# Patient Record
Sex: Female | Born: 1940 | Race: White | Hispanic: No | Smoking: Former smoker
Health system: Southern US, Community
[De-identification: ages and names within clinical notes are randomized; demographics above are authoritative.]

## PROBLEM LIST (undated history)

## (undated) DIAGNOSIS — J449 Chronic obstructive pulmonary disease, unspecified: Secondary | ICD-10-CM

## (undated) DIAGNOSIS — R079 Chest pain, unspecified: Secondary | ICD-10-CM

## (undated) DIAGNOSIS — J45909 Unspecified asthma, uncomplicated: Secondary | ICD-10-CM

## (undated) DIAGNOSIS — R918 Other nonspecific abnormal finding of lung field: Secondary | ICD-10-CM

## (undated) DIAGNOSIS — F419 Anxiety disorder, unspecified: Secondary | ICD-10-CM

## (undated) DIAGNOSIS — M810 Age-related osteoporosis without current pathological fracture: Secondary | ICD-10-CM

## (undated) DIAGNOSIS — E785 Hyperlipidemia, unspecified: Secondary | ICD-10-CM

## (undated) DIAGNOSIS — Z8701 Personal history of pneumonia (recurrent): Secondary | ICD-10-CM

## (undated) DIAGNOSIS — I493 Ventricular premature depolarization: Secondary | ICD-10-CM

## (undated) DIAGNOSIS — E039 Hypothyroidism, unspecified: Secondary | ICD-10-CM

## (undated) DIAGNOSIS — I10 Essential (primary) hypertension: Secondary | ICD-10-CM

## (undated) DIAGNOSIS — S02609A Fracture of mandible, unspecified, initial encounter for closed fracture: Secondary | ICD-10-CM

## (undated) DIAGNOSIS — L309 Dermatitis, unspecified: Secondary | ICD-10-CM

## (undated) HISTORY — DX: Personal history of pneumonia (recurrent): Z87.01

## (undated) HISTORY — PX: BREAST ENHANCEMENT SURGERY: SHX7

## (undated) HISTORY — DX: Hypothyroidism, unspecified: E03.9

## (undated) HISTORY — DX: Hyperlipidemia, unspecified: E78.5

## (undated) HISTORY — PX: MANDIBLE SURGERY: SHX707

## (undated) HISTORY — DX: Unspecified asthma, uncomplicated: J45.909

## (undated) HISTORY — DX: Fracture of mandible, unspecified, initial encounter for closed fracture: S02.609A

## (undated) HISTORY — DX: Chronic obstructive pulmonary disease, unspecified: J44.9

## (undated) HISTORY — DX: Ventricular premature depolarization: I49.3

## (undated) HISTORY — DX: Chest pain, unspecified: R07.9

## (undated) HISTORY — DX: Age-related osteoporosis without current pathological fracture: M81.0

## (undated) HISTORY — DX: Dermatitis, unspecified: L30.9

## (undated) HISTORY — DX: Other nonspecific abnormal finding of lung field: R91.8

## (undated) HISTORY — DX: Anxiety disorder, unspecified: F41.9

## (undated) HISTORY — DX: Essential (primary) hypertension: I10

## (undated) HISTORY — PX: THYROID SURGERY: SHX805

---

## 2004-12-08 ENCOUNTER — Ambulatory Visit: Payer: Self-pay

## 2006-05-10 ENCOUNTER — Ambulatory Visit: Payer: Self-pay

## 2006-09-01 HISTORY — PX: CARDIAC CATHETERIZATION: SHX172

## 2006-09-12 ENCOUNTER — Ambulatory Visit: Payer: Self-pay | Admitting: Cardiovascular Disease

## 2006-09-13 ENCOUNTER — Ambulatory Visit: Payer: Self-pay | Admitting: Cardiovascular Disease

## 2006-10-02 ENCOUNTER — Ambulatory Visit: Payer: Self-pay | Admitting: Internal Medicine

## 2006-10-23 ENCOUNTER — Ambulatory Visit: Payer: Self-pay | Admitting: Internal Medicine

## 2006-11-01 ENCOUNTER — Ambulatory Visit: Payer: Self-pay | Admitting: Internal Medicine

## 2006-12-02 ENCOUNTER — Ambulatory Visit: Payer: Self-pay | Admitting: Internal Medicine

## 2007-01-01 ENCOUNTER — Ambulatory Visit: Payer: Self-pay | Admitting: Internal Medicine

## 2007-01-08 ENCOUNTER — Ambulatory Visit: Payer: Self-pay | Admitting: Internal Medicine

## 2007-02-01 ENCOUNTER — Ambulatory Visit: Payer: Self-pay | Admitting: Internal Medicine

## 2007-05-14 ENCOUNTER — Ambulatory Visit: Payer: Self-pay | Admitting: Internal Medicine

## 2007-06-03 ENCOUNTER — Ambulatory Visit: Payer: Self-pay | Admitting: Internal Medicine

## 2007-06-19 ENCOUNTER — Ambulatory Visit: Payer: Self-pay | Admitting: Gastroenterology

## 2007-08-06 ENCOUNTER — Ambulatory Visit: Payer: Self-pay | Admitting: Ophthalmology

## 2007-08-12 ENCOUNTER — Ambulatory Visit: Payer: Self-pay | Admitting: Ophthalmology

## 2008-06-18 ENCOUNTER — Ambulatory Visit: Payer: Self-pay | Admitting: Family Medicine

## 2009-07-22 ENCOUNTER — Ambulatory Visit: Payer: Self-pay | Admitting: Family Medicine

## 2010-04-28 ENCOUNTER — Ambulatory Visit: Payer: Self-pay | Admitting: Ophthalmology

## 2010-05-03 ENCOUNTER — Ambulatory Visit: Payer: Self-pay | Admitting: Family Medicine

## 2010-05-09 ENCOUNTER — Ambulatory Visit: Payer: Self-pay | Admitting: Ophthalmology

## 2010-07-26 ENCOUNTER — Ambulatory Visit: Payer: Self-pay | Admitting: General Surgery

## 2011-03-08 ENCOUNTER — Ambulatory Visit: Payer: Self-pay | Admitting: General Surgery

## 2011-08-22 ENCOUNTER — Ambulatory Visit: Payer: Self-pay

## 2011-12-05 ENCOUNTER — Ambulatory Visit: Payer: Self-pay | Admitting: Specialist

## 2012-04-25 ENCOUNTER — Ambulatory Visit: Payer: Self-pay | Admitting: Specialist

## 2012-09-11 ENCOUNTER — Ambulatory Visit: Payer: Self-pay

## 2012-10-29 ENCOUNTER — Ambulatory Visit: Payer: Self-pay | Admitting: Specialist

## 2012-11-11 ENCOUNTER — Ambulatory Visit: Payer: Self-pay | Admitting: Specialist

## 2013-01-30 ENCOUNTER — Encounter: Payer: Self-pay | Admitting: *Deleted

## 2013-01-31 ENCOUNTER — Ambulatory Visit (INDEPENDENT_AMBULATORY_CARE_PROVIDER_SITE_OTHER): Payer: Medicare Other | Admitting: Cardiovascular Disease

## 2013-01-31 ENCOUNTER — Encounter: Payer: Self-pay | Admitting: Cardiovascular Disease

## 2013-01-31 VITALS — BP 153/70 | HR 88 | Ht 70.0 in | Wt 157.5 lb

## 2013-01-31 DIAGNOSIS — R0602 Shortness of breath: Secondary | ICD-10-CM

## 2013-01-31 DIAGNOSIS — I4949 Other premature depolarization: Secondary | ICD-10-CM

## 2013-01-31 DIAGNOSIS — Z01810 Encounter for preprocedural cardiovascular examination: Secondary | ICD-10-CM

## 2013-01-31 DIAGNOSIS — R0789 Other chest pain: Secondary | ICD-10-CM

## 2013-01-31 DIAGNOSIS — R9431 Abnormal electrocardiogram [ECG] [EKG]: Secondary | ICD-10-CM

## 2013-01-31 DIAGNOSIS — I493 Ventricular premature depolarization: Secondary | ICD-10-CM

## 2013-01-31 DIAGNOSIS — I200 Unstable angina: Secondary | ICD-10-CM | POA: Insufficient documentation

## 2013-01-31 NOTE — Assessment & Plan Note (Signed)
The patient had 2 recent prolonged episodes of substernal chest tightness which lasted for about one hour. These are highly suggestive of angina. Since that time, she has been noted to have frequent PVCs during pulmonary rehabilitation. Hent baseline ECG is abnormal with 0.5 mm of ST depression in the inferior leads and V5 to V6. She is currently chest pain-free and has not had these symptoms in the last 48 hours. Thus, I don't feel that she needs to be admitted to the hospital. I discussed different management options with her and highly recommend proceeding with urgent cardiac catheterization and possible coronary intervention. I discussed the risks, benefits and alternatives. The alternative approach would be to proceed with a pharmacologic nuclear stress test. However, I suspect that this would have low diagnostic utility with high risk for artifacts. I asked her to take aspirin 81 mg once daily which normally she avoids taking due to easy bruising.

## 2013-01-31 NOTE — Patient Instructions (Addendum)
Your physician has requested that you have a cardiac catheterization. Cardiac catheterization is used to diagnose and/or treat various heart conditions. Doctors may recommend this procedure for a number of different reasons. The most common reason is to evaluate chest pain. Chest pain can be a symptom of coronary artery disease (CAD), and cardiac catheterization can show whether plaque is narrowing or blocking your heart's arteries. This procedure is also used to evaluate the valves, as well as measure the blood flow and oxygen levels in different parts of your heart. For further information please visit [email or web address] Please follow instruction sheet, as given.  [facility] Cardiac Cath Instructions   You are scheduled for a Cardiac Cath on:____08/04/14_____________________  Please arrive at __2:30_____pm on the day of your procedure  You will need to pre-register prior to the day of your procedure.  Enter through the [employer] at [facility].  Registration is the first desk on your right.  Please take the procedure order we have given you in order to be registered appropriately  Do not eat/drink anything after midnight  Someone will need to drive you home  It is recommended someone be with you for the first 24 hours after your procedure  Wear clothes that are easy to get on/off and wear slip on shoes if possible   Medications bring a current list of all medications with you  _X__ Do not take these medications before your procedure: Hydrochlorathiazide day of procedure.   Day of your procedure: Arrive at the [facility] entrance.  Free valet service is available.  After entering the [facility] please check-in at the registration desk (1st desk on your right) to receive your armband. After receiving your armband someone will escort you to the cardiac cath/special procedures waiting area.  The usual length of stay after your procedure is about 2 to 3 hours.  This can vary.  If you have any  questions, please call our office at [phone], or you may call the cardiac cath lab at [facility] directly at [phone]

## 2013-01-31 NOTE — Assessment & Plan Note (Signed)
I do think we have to rule out ischemic heart disease as an etiology especially with her recent symptoms of chest tightness. Treatment with a calcium channel blocker will be considered. I will likely avoid treatment with a beta blocker due to severe COPD.

## 2013-01-31 NOTE — Progress Notes (Signed)
HPI  This is a pleasant 72 year old female who is here today for urgent evaluation regarding chest tightness and frequent PVCs. She has no previous cardiac history. She had cardiac catheterization in 2008 which showed no evidence of obstructive coronary artery disease. She has known history of hypertension and hyperlipidemia. She also has severe COPD on home oxygen at 2-4 L. She quit smoking more than 10 years ago but was a heavy smoker for 40 years. Last weekend both on Saturday and Sunday early morning hours, she woke up with severe substernal chest tightness described as an elephant sitting on her chest which lasted for about one hour. She did not seek medical attention. She reports no previous similar episodes. She had been going to pulmonary rehabilitation for a long time and was noted this week to have frequent PVCs. She did have palpitations associated with this. She had significant dyspnea related to her COPD. She is currently chest pain-free. She does have history of pulmonary nodules which are being monitored with serial CT scans. She sees Dr. Fleming.  Allergies  Allergen Reactions  . Zithromax (Azithromycin)     Tendon problems     Current Outpatient Prescriptions on File Prior to Visit  Medication Sig Dispense Refill  . albuterol (PROVENTIL HFA;VENTOLIN HFA) 108 (90 BASE) MCG/ACT inhaler Inhale 2 puffs into the lungs every 6 (six) hours as needed for wheezing.      . aspirin 81 MG tablet Take 81 mg by mouth daily.      . benzonatate (TESSALON) 200 MG capsule Take 200 mg by mouth every 12 (twelve) hours as needed for cough.      . citalopram (CELEXA) 20 MG tablet Take 20 mg by mouth daily.      . fluticasone-salmeterol (ADVAIR HFA) 115-21 MCG/ACT inhaler Inhale 2 puffs into the lungs 2 (two) times daily.      . hydrochlorothiazide (HYDRODIURIL) 25 MG tablet Take 25 mg by mouth daily.      . levothyroxine (SYNTHROID, LEVOTHROID) 112 MCG tablet Take 112 mcg by mouth daily before  breakfast.      . LOVASTATIN PO Take 40 mg by mouth daily.       . theophylline (THEODUR) 100 MG 12 hr tablet Take 100 mg by mouth 2 (two) times daily.       No current facility-administered medications on file prior to visit.     Past Medical History  Diagnosis Date  . COPD (chronic obstructive pulmonary disease)   . Pneumonia   . Eczema   . Hypothyroidism   . Jaw fracture   . Osteoporosis   . Hyperlipidemia   . Asthma      Past Surgical History  Procedure Laterality Date  . Breast enhancement surgery    . Thyroid surgery    . Mandible surgery      fractured jaw   . Cardiac catheterization  09/2006    EF 60%     Family History  Problem Relation Age of Onset  . Heart disease Father     68   . Heart disease Brother 28  . Heart attack Brother 47     History   Social History  . Marital Status: Divorced    Spouse Name: N/A    Number of Children: N/A  . Years of Education: N/A   Occupational History  . Not on file.   Social History Main Topics  . Smoking status: Former Smoker -- 1.00 packs/day for 40 years    Types:  Cigarettes  . Smokeless tobacco: Not on file  . Alcohol Use: No  . Drug Use: No  . Sexually Active: Not on file   Other Topics Concern  . Not on file   Social History Narrative  . No narrative on file     ROS Constitutional: Negative for fever, chills, diaphoresis, activity change. HENT: Negative for hearing loss, nosebleeds, congestion, sore throat, facial swelling, drooling, trouble swallowing, neck pain, voice change, sinus pressure and tinnitus.  Eyes: Negative for photophobia, pain, discharge and visual disturbance.  Respiratory: Negative for apnea, cough and wheezing.  Cardiovascular: Negative for  leg swelling.  Gastrointestinal: Negative for nausea, vomiting, abdominal pain, diarrhea, constipation, blood in stool and abdominal distention.  Genitourinary: Negative for dysuria, urgency, frequency, hematuria and decreased urine  volume.  Musculoskeletal: Negative for myalgias, back pain, joint swelling, arthralgias and gait problem.  Skin: Negative for color change, pallor, rash and wound.  Neurological: Negative for dizziness, tremors, seizures, syncope, speech difficulty, weakness, light-headedness, numbness and headaches.  Psychiatric/Behavioral: Negative for suicidal ideas, hallucinations, behavioral problems and agitation. The patient is not nervous/anxious.     PHYSICAL EXAM   BP 153/70  Pulse 88  Ht 5\' 10"  (1.778 m)  Wt 157 lb 8 oz (71.442 kg)  BMI 22.6 kg/m2 Constitutional: She is oriented to person, place, and time. She appears well-developed and well-nourished. No distress.  HENT: No nasal discharge.  Head: Normocephalic and atraumatic.  Eyes: Pupils are equal and round. Right eye exhibits no discharge. Left eye exhibits no discharge.  Neck: Normal range of motion. Neck supple. No JVD present. No thyromegaly present.  Cardiovascular: Normal rate, regular rhythm, normal heart sounds. Exam reveals no gallop and no friction rub. No murmur heard.  Pulmonary/Chest: Effort normal and breath sounds normal. No stridor. No respiratory distress. She has no wheezes. She has no rales. She exhibits no tenderness.  Abdominal: Soft. Bowel sounds are normal. She exhibits no distension. There is no tenderness. There is no rebound and no guarding.  Musculoskeletal: Normal range of motion. She exhibits no edema and no tenderness.  Neurological: She is alert and oriented to person, place, and time. Coordination normal.  Skin: Skin is warm and dry. No rash noted. She is not diaphoretic. No erythema. No pallor.  Psychiatric: She has a normal mood and affect. Her behavior is normal. Judgment and thought content normal.     ZOX:WRUEA  Rhythm  - occasional ectopic ventricular beat    -Inferior inferolateral ST depression (0.5 mm) suggestive of ischemia.  ABNORMAL    ASSESSMENT AND PLAN

## 2013-02-01 LAB — CBC WITH DIFFERENTIAL/PLATELET
Eos: 4 % (ref 0–5)
HCT: 38.8 % (ref 34.0–46.6)
Immature Granulocytes: 0 % (ref 0–2)
Lymphocytes Absolute: 1.6 10*3/uL (ref 0.7–3.1)
MCH: 29.5 pg (ref 26.6–33.0)
MCHC: 33.2 g/dL (ref 31.5–35.7)
MCV: 89 fL (ref 79–97)
Monocytes Absolute: 0.7 10*3/uL (ref 0.1–0.9)
RDW: 13.7 % (ref 12.3–15.4)
WBC: 6.7 10*3/uL (ref 3.4–10.8)

## 2013-02-01 LAB — BASIC METABOLIC PANEL
BUN/Creatinine Ratio: 24 (ref 11–26)
GFR calc Af Amer: 91 mL/min/{1.73_m2} (ref >59)
GFR calc non Af Amer: 79 mL/min/{1.73_m2} (ref >59)
Potassium: 4 mmol/L (ref 3.5–5.2)
Sodium: 143 mmol/L (ref 134–144)

## 2013-02-03 ENCOUNTER — Ambulatory Visit: Payer: Self-pay | Admitting: Cardiovascular Disease

## 2013-02-03 ENCOUNTER — Encounter: Payer: Self-pay | Admitting: Cardiovascular Disease

## 2013-02-03 DIAGNOSIS — I200 Unstable angina: Secondary | ICD-10-CM

## 2013-02-12 ENCOUNTER — Inpatient Hospital Stay: Payer: Self-pay | Admitting: Specialist

## 2013-02-12 LAB — BASIC METABOLIC PANEL
Anion Gap: 5 — ABNORMAL LOW (ref 7–16)
Calcium, Total: 9.1 mg/dL (ref 8.5–10.1)
Creatinine: 0.64 mg/dL (ref 0.60–1.30)
EGFR (African American): >60
Glucose: 134 mg/dL — ABNORMAL HIGH (ref 65–99)
Potassium: 3.6 mmol/L (ref 3.5–5.1)
Sodium: 139 mmol/L (ref 136–145)

## 2013-02-12 LAB — CBC
HCT: 40.5 % (ref 35.0–47.0)
HGB: 13.3 g/dL (ref 12.0–16.0)
MCHC: 32.9 g/dL (ref 32.0–36.0)
RBC: 4.54 10*6/uL (ref 3.80–5.20)
WBC: 9.4 10*3/uL (ref 3.6–11.0)

## 2013-02-13 ENCOUNTER — Ambulatory Visit: Payer: Self-pay | Admitting: Cardiovascular Disease

## 2013-02-13 LAB — BASIC METABOLIC PANEL
BUN: 18 mg/dL (ref 7–18)
Chloride: 101 mmol/L (ref 98–107)
Co2: 32 mmol/L (ref 21–32)
EGFR (African American): >60
EGFR (Non-African Amer.): >60
Osmolality: 282 (ref 275–301)
Sodium: 138 mmol/L (ref 136–145)

## 2013-02-13 LAB — CBC WITH DIFFERENTIAL/PLATELET
HCT: 34.3 % — ABNORMAL LOW (ref 35.0–47.0)
HGB: 11.6 g/dL — ABNORMAL LOW (ref 12.0–16.0)
Lymphocyte #: 0.5 10*3/uL — ABNORMAL LOW (ref 1.0–3.6)
Lymphocyte %: 5.5 %
MCH: 29.7 pg (ref 26.0–34.0)
MCHC: 33.9 g/dL (ref 32.0–36.0)
MCV: 88 fL (ref 80–100)
Monocyte #: 0.1 x10 3/mm — ABNORMAL LOW (ref 0.2–0.9)
Monocyte %: 1.5 %
Neutrophil #: 7.8 10*3/uL — ABNORMAL HIGH (ref 1.4–6.5)
WBC: 8.4 10*3/uL (ref 3.6–11.0)

## 2013-02-14 LAB — THEOPHYLLINE LEVEL: Theophylline: 4.6 ug/mL — ABNORMAL LOW (ref 10.0–20.0)

## 2013-02-14 LAB — MAGNESIUM: Magnesium: 2.3 mg/dL

## 2013-02-16 LAB — POTASSIUM: Potassium: 3.9 mmol/L (ref 3.5–5.1)

## 2013-02-17 ENCOUNTER — Ambulatory Visit: Payer: Medicare Other | Admitting: Cardiovascular Disease

## 2013-02-17 LAB — CREATININE, SERUM
Creatinine: 0.72 mg/dL (ref 0.60–1.30)
EGFR (African American): >60
EGFR (Non-African Amer.): >60

## 2013-05-08 ENCOUNTER — Other Ambulatory Visit: Payer: Self-pay

## 2013-08-30 ENCOUNTER — Emergency Department: Payer: Self-pay | Admitting: Emergency Medicine

## 2013-08-30 LAB — CBC
HCT: 40.6 %
HGB: 12.8 g/dL
MCH: 28.4 pg
MCHC: 31.4 g/dL — ABNORMAL LOW
MCV: 90 fL
Platelet: 235 x10 3/mm 3
RBC: 4.5 X10 6/mm 3
RDW: 14.3 %
WBC: 13 x10 3/mm 3 — ABNORMAL HIGH

## 2013-08-30 LAB — BASIC METABOLIC PANEL WITH GFR
Anion Gap: 1 — ABNORMAL LOW
BUN: 23 mg/dL — ABNORMAL HIGH
Calcium, Total: 8.5 mg/dL
Chloride: 99 mmol/L
Co2: 39 mmol/L — ABNORMAL HIGH
Creatinine: 0.7 mg/dL
EGFR (African American): >60
EGFR (Non-African Amer.): >60
Glucose: 147 mg/dL — ABNORMAL HIGH
Osmolality: 284
Potassium: 3.5 mmol/L
Sodium: 139 mmol/L

## 2013-08-30 LAB — TROPONIN I: Troponin-I: <0.02 ng/mL

## 2013-08-31 LAB — TROPONIN I: Troponin-I: <0.02 ng/mL

## 2013-09-22 ENCOUNTER — Ambulatory Visit: Payer: Self-pay

## 2013-09-30 ENCOUNTER — Ambulatory Visit: Payer: Self-pay

## 2013-10-03 ENCOUNTER — Ambulatory Visit: Payer: Self-pay | Admitting: Specialist

## 2013-10-06 ENCOUNTER — Ambulatory Visit: Payer: Self-pay

## 2013-10-09 LAB — PATHOLOGY REPORT

## 2014-02-16 ENCOUNTER — Ambulatory Visit: Payer: Self-pay

## 2014-03-03 ENCOUNTER — Ambulatory Visit: Payer: Self-pay | Admitting: Internal Medicine

## 2014-03-10 LAB — COMPREHENSIVE METABOLIC PANEL
ALBUMIN: 3.4 g/dL (ref 3.4–5.0)
AST: 32 U/L (ref 15–37)
Alkaline Phosphatase: 66 U/L
Anion Gap: 10 (ref 7–16)
BUN: 18 mg/dL (ref 7–18)
Bilirubin,Total: 0.3 mg/dL (ref 0.2–1.0)
CHLORIDE: 91 mmol/L — AB (ref 98–107)
CREATININE: 1.03 mg/dL (ref 0.60–1.30)
Calcium, Total: 9.1 mg/dL (ref 8.5–10.1)
Co2: 39 mmol/L — ABNORMAL HIGH (ref 21–32)
GFR CALC NON AF AMER: 54 — AB
Glucose: 297 mg/dL — ABNORMAL HIGH (ref 65–99)
OSMOLALITY: 292 (ref 275–301)
Potassium: 3.1 mmol/L — ABNORMAL LOW (ref 3.5–5.1)
SGPT (ALT): 19 U/L
SODIUM: 140 mmol/L (ref 136–145)
Total Protein: 7.6 g/dL (ref 6.4–8.2)

## 2014-03-10 LAB — CBC
HCT: 41.6 % (ref 35.0–47.0)
HGB: 13.2 g/dL (ref 12.0–16.0)
MCH: 28.9 pg (ref 26.0–34.0)
MCHC: 31.8 g/dL — ABNORMAL LOW (ref 32.0–36.0)
MCV: 91 fL (ref 80–100)
Platelet: 333 10*3/uL (ref 150–440)
RBC: 4.58 10*6/uL (ref 3.80–5.20)
RDW: 14.1 % (ref 11.5–14.5)
WBC: 11.5 10*3/uL — ABNORMAL HIGH (ref 3.6–11.0)

## 2014-03-10 LAB — TROPONIN I: Troponin-I: <0.02 ng/mL

## 2014-03-10 LAB — CK TOTAL AND CKMB (NOT AT ARMC)
CK, Total: 41 U/L
CK-MB: 1.5 ng/mL (ref 0.5–3.6)

## 2014-03-11 ENCOUNTER — Inpatient Hospital Stay: Payer: Self-pay | Admitting: Internal Medicine

## 2014-03-11 LAB — URINALYSIS, COMPLETE
Bilirubin,UR: NEGATIVE
Granular Cast: 1
Hyaline Cast: 30
Ketone: NEGATIVE
LEUKOCYTE ESTERASE: NEGATIVE
NITRITE: NEGATIVE
Ph: 6 (ref 4.5–8.0)
Protein: 100
RBC,UR: 19 /HPF (ref 0–5)
SPECIFIC GRAVITY: 1.013 (ref 1.003–1.030)

## 2014-03-11 LAB — BASIC METABOLIC PANEL
Anion Gap: 10 (ref 7–16)
BUN: 24 mg/dL — ABNORMAL HIGH (ref 7–18)
CREATININE: 0.8 mg/dL (ref 0.60–1.30)
Calcium, Total: 8.4 mg/dL — ABNORMAL LOW (ref 8.5–10.1)
Chloride: 99 mmol/L (ref 98–107)
Co2: 30 mmol/L (ref 21–32)
EGFR (African American): >60
EGFR (Non-African Amer.): >60
Glucose: 253 mg/dL — ABNORMAL HIGH (ref 65–99)
OSMOLALITY: 290 (ref 275–301)
Potassium: 3.3 mmol/L — ABNORMAL LOW (ref 3.5–5.1)
SODIUM: 139 mmol/L (ref 136–145)

## 2014-03-11 LAB — TSH: Thyroid Stimulating Horm: 0.229 u[IU]/mL — ABNORMAL LOW

## 2014-03-11 LAB — PHOSPHORUS: Phosphorus: 2.9 mg/dL (ref 2.5–4.9)

## 2014-03-11 LAB — MAGNESIUM: Magnesium: 1.6 mg/dL — ABNORMAL LOW

## 2014-03-12 LAB — CBC WITH DIFFERENTIAL/PLATELET
Basophil #: 0 10*3/uL (ref 0.0–0.1)
Basophil %: 0.1 %
Eosinophil #: 0 10*3/uL (ref 0.0–0.7)
Eosinophil %: 0.1 %
HCT: 36.7 % (ref 35.0–47.0)
HGB: 11.2 g/dL — ABNORMAL LOW (ref 12.0–16.0)
Lymphocyte #: 0.8 10*3/uL — ABNORMAL LOW (ref 1.0–3.6)
Lymphocyte %: 5.7 %
MCH: 28.1 pg (ref 26.0–34.0)
MCHC: 30.4 g/dL — ABNORMAL LOW (ref 32.0–36.0)
MCV: 92 fL (ref 80–100)
Monocyte #: 1.3 x10 3/mm — ABNORMAL HIGH (ref 0.2–0.9)
Monocyte %: 9.1 %
Neutrophil #: 12.4 10*3/uL — ABNORMAL HIGH (ref 1.4–6.5)
Neutrophil %: 85 %
Platelet: 267 10*3/uL (ref 150–440)
RBC: 3.98 10*6/uL (ref 3.80–5.20)
RDW: 14.5 % (ref 11.5–14.5)
WBC: 14.6 10*3/uL — ABNORMAL HIGH (ref 3.6–11.0)

## 2014-03-12 LAB — BASIC METABOLIC PANEL
Anion Gap: 6 — ABNORMAL LOW (ref 7–16)
BUN: 13 mg/dL (ref 7–18)
Calcium, Total: 7.9 mg/dL — ABNORMAL LOW (ref 8.5–10.1)
Chloride: 106 mmol/L (ref 98–107)
Co2: 32 mmol/L (ref 21–32)
Creatinine: 0.7 mg/dL (ref 0.60–1.30)
EGFR (African American): >60
EGFR (Non-African Amer.): >60
Glucose: 129 mg/dL — ABNORMAL HIGH (ref 65–99)
Osmolality: 289 (ref 275–301)
Potassium: 3.6 mmol/L (ref 3.5–5.1)
Sodium: 144 mmol/L (ref 136–145)

## 2014-03-12 LAB — MAGNESIUM: Magnesium: 2.1 mg/dL

## 2014-03-12 LAB — PHOSPHORUS: Phosphorus: 2.5 mg/dL (ref 2.5–4.9)

## 2014-03-13 LAB — CBC WITH DIFFERENTIAL/PLATELET
Basophil #: 0 10*3/uL (ref 0.0–0.1)
Basophil %: 0.3 %
Eosinophil #: 0 10*3/uL (ref 0.0–0.7)
Eosinophil %: 0.1 %
HCT: 37.9 % (ref 35.0–47.0)
HGB: 12 g/dL (ref 12.0–16.0)
Lymphocyte #: 1.6 10*3/uL (ref 1.0–3.6)
Lymphocyte %: 11.7 %
MCH: 29.3 pg (ref 26.0–34.0)
MCHC: 31.8 g/dL — ABNORMAL LOW (ref 32.0–36.0)
MCV: 92 fL (ref 80–100)
Monocyte #: 1.4 x10 3/mm — ABNORMAL HIGH (ref 0.2–0.9)
Monocyte %: 10.1 %
Neutrophil #: 10.7 10*3/uL — ABNORMAL HIGH (ref 1.4–6.5)
Neutrophil %: 77.8 %
Platelet: 285 10*3/uL (ref 150–440)
RBC: 4.11 10*6/uL (ref 3.80–5.20)
RDW: 14.7 % — ABNORMAL HIGH (ref 11.5–14.5)
WBC: 13.7 10*3/uL — ABNORMAL HIGH (ref 3.6–11.0)

## 2014-03-13 LAB — BASIC METABOLIC PANEL
Anion Gap: 8 (ref 7–16)
BUN: 13 mg/dL (ref 7–18)
Calcium, Total: 7.8 mg/dL — ABNORMAL LOW (ref 8.5–10.1)
Chloride: 104 mmol/L (ref 98–107)
Co2: 32 mmol/L (ref 21–32)
Creatinine: 0.51 mg/dL — ABNORMAL LOW (ref 0.60–1.30)
EGFR (African American): >60
EGFR (Non-African Amer.): >60
Glucose: 97 mg/dL (ref 65–99)
Osmolality: 287 (ref 275–301)
Potassium: 4.3 mmol/L (ref 3.5–5.1)
Sodium: 144 mmol/L (ref 136–145)

## 2014-03-13 LAB — PHOSPHORUS: Phosphorus: 2.4 mg/dL — ABNORMAL LOW (ref 2.5–4.9)

## 2014-03-13 LAB — THEOPHYLLINE LEVEL: Theophylline: 4.6 ug/mL — ABNORMAL LOW (ref 10.0–20.0)

## 2014-03-13 LAB — MAGNESIUM: Magnesium: 2.1 mg/dL

## 2014-03-15 LAB — TROPONIN I: Troponin-I: 0.2 ng/mL — ABNORMAL HIGH

## 2014-03-15 LAB — CK TOTAL AND CKMB (NOT AT ARMC)
CK, TOTAL: 89 U/L
CK-MB: 4.9 ng/mL — AB (ref 0.5–3.6)

## 2014-03-16 ENCOUNTER — Other Ambulatory Visit: Payer: Self-pay | Admitting: Nurse Practitioner

## 2014-03-16 ENCOUNTER — Encounter: Payer: Self-pay | Admitting: Nurse Practitioner

## 2014-03-16 DIAGNOSIS — R000 Tachycardia, unspecified: Secondary | ICD-10-CM

## 2014-03-16 DIAGNOSIS — I359 Nonrheumatic aortic valve disorder, unspecified: Secondary | ICD-10-CM

## 2014-03-16 DIAGNOSIS — J449 Chronic obstructive pulmonary disease, unspecified: Secondary | ICD-10-CM

## 2014-03-16 DIAGNOSIS — I10 Essential (primary) hypertension: Secondary | ICD-10-CM

## 2014-03-16 LAB — BASIC METABOLIC PANEL
BUN: 11 mg/dL (ref 7–18)
Calcium, Total: 9 mg/dL (ref 8.5–10.1)
Chloride: 82 mmol/L — ABNORMAL LOW (ref 98–107)
Creatinine: 0.51 mg/dL — ABNORMAL LOW (ref 0.60–1.30)
EGFR (African American): >60
EGFR (Non-African Amer.): >60
Glucose: 161 mg/dL — ABNORMAL HIGH (ref 65–99)
Osmolality: 275 (ref 275–301)
Potassium: 3.7 mmol/L (ref 3.5–5.1)
SODIUM: 136 mmol/L (ref 136–145)

## 2014-03-16 LAB — CK TOTAL AND CKMB (NOT AT ARMC)
CK, Total: 89 U/L
CK-MB: 6.8 ng/mL — ABNORMAL HIGH (ref 0.5–3.6)

## 2014-03-16 LAB — CULTURE, BLOOD (SINGLE)

## 2014-03-16 LAB — TROPONIN I: Troponin-I: 0.18 ng/mL — ABNORMAL HIGH

## 2014-03-17 LAB — CBC WITH DIFFERENTIAL/PLATELET
BASOS ABS: 0 10*3/uL (ref 0.0–0.1)
Basophil %: 0.1 %
EOS PCT: 0 %
Eosinophil #: 0 10*3/uL (ref 0.0–0.7)
HCT: 35.8 % (ref 35.0–47.0)
HGB: 11 g/dL — AB (ref 12.0–16.0)
LYMPHS ABS: 0.3 10*3/uL — AB (ref 1.0–3.6)
Lymphocyte %: 5.1 %
MCH: 28.5 pg (ref 26.0–34.0)
MCHC: 30.8 g/dL — AB (ref 32.0–36.0)
MCV: 93 fL (ref 80–100)
MONOS PCT: 9.1 %
Monocyte #: 0.5 x10 3/mm (ref 0.2–0.9)
NEUTROS ABS: 4.3 10*3/uL (ref 1.4–6.5)
Neutrophil %: 85.7 %
Platelet: 260 10*3/uL (ref 150–440)
RBC: 3.87 10*6/uL (ref 3.80–5.20)
RDW: 14.3 % (ref 11.5–14.5)
WBC: 5 10*3/uL (ref 3.6–11.0)

## 2014-03-17 LAB — BASIC METABOLIC PANEL
Anion Gap: 11 (ref 7–16)
BUN: 16 mg/dL (ref 7–18)
Calcium, Total: 8.4 mg/dL — ABNORMAL LOW (ref 8.5–10.1)
Chloride: 87 mmol/L — ABNORMAL LOW (ref 98–107)
Co2: 39 mmol/L — ABNORMAL HIGH (ref 21–32)
Creatinine: 0.71 mg/dL (ref 0.60–1.30)
EGFR (Non-African Amer.): >60
GLUCOSE: 124 mg/dL — AB (ref 65–99)
OSMOLALITY: 276 (ref 275–301)
POTASSIUM: 2.8 mmol/L — AB (ref 3.5–5.1)
Sodium: 137 mmol/L (ref 136–145)

## 2014-03-17 LAB — POTASSIUM: Potassium: 3.6 mmol/L (ref 3.5–5.1)

## 2014-03-17 LAB — MAGNESIUM
MAGNESIUM: 2.5 mg/dL — AB
Magnesium: 1.8 mg/dL

## 2014-03-18 LAB — BASIC METABOLIC PANEL
Anion Gap: 7 (ref 7–16)
BUN: 26 mg/dL — ABNORMAL HIGH (ref 7–18)
CREATININE: 0.68 mg/dL (ref 0.60–1.30)
Calcium, Total: 8.1 mg/dL — ABNORMAL LOW (ref 8.5–10.1)
Chloride: 96 mmol/L — ABNORMAL LOW (ref 98–107)
Co2: 38 mmol/L — ABNORMAL HIGH (ref 21–32)
EGFR (African American): >60
EGFR (Non-African Amer.): >60
Glucose: 96 mg/dL (ref 65–99)
OSMOLALITY: 286 (ref 275–301)
Potassium: 3.3 mmol/L — ABNORMAL LOW (ref 3.5–5.1)
Sodium: 141 mmol/L (ref 136–145)

## 2014-03-18 LAB — VANCOMYCIN, TROUGH: Vancomycin, Trough: 12 ug/mL (ref 10–20)

## 2014-03-18 LAB — MAGNESIUM: Magnesium: 1.8 mg/dL

## 2014-03-18 LAB — POTASSIUM: Potassium: 4.6 mmol/L (ref 3.5–5.1)

## 2014-03-18 LAB — PHOSPHORUS: Phosphorus: 3.2 mg/dL (ref 2.5–4.9)

## 2014-03-19 LAB — BASIC METABOLIC PANEL
Anion Gap: 0 — ABNORMAL LOW (ref 7–16)
BUN: 25 mg/dL — AB (ref 7–18)
CREATININE: 0.59 mg/dL — AB (ref 0.60–1.30)
Calcium, Total: 8.8 mg/dL (ref 8.5–10.1)
Chloride: 96 mmol/L — ABNORMAL LOW (ref 98–107)
Co2: 43 mmol/L (ref 21–32)
EGFR (African American): >60
EGFR (Non-African Amer.): >60
Glucose: 134 mg/dL — ABNORMAL HIGH (ref 65–99)
OSMOLALITY: 284 (ref 275–301)
Potassium: 4 mmol/L (ref 3.5–5.1)
SODIUM: 139 mmol/L (ref 136–145)

## 2014-03-19 LAB — PHOSPHORUS: Phosphorus: 2.1 mg/dL — ABNORMAL LOW (ref 2.5–4.9)

## 2014-03-19 LAB — THEOPHYLLINE LEVEL: Theophylline: 7.3 ug/mL — ABNORMAL LOW (ref 10.0–20.0)

## 2014-03-19 LAB — MAGNESIUM: MAGNESIUM: 1.8 mg/dL

## 2014-04-02 ENCOUNTER — Ambulatory Visit: Payer: Self-pay | Admitting: Internal Medicine

## 2014-04-02 DEATH — deceased

## 2014-10-23 NOTE — Discharge Summary (Signed)
PATIENT NAME:  Megan Huerta MR#:  [id number] DATE OF BIRTH:  10/27/1940  For a detailed note, please see the history and physical done on admission by me.   DIAGNOSES AT DISCHARGE:  1.  Acute respiratory failure secondary to chronic obstructive pulmonary disease exacerbation.  2.  Chronic obstructive pulmonary disease exacerbation.  3.  Obstipation/constipation, now resolved.  4.  Anxiety.  5.  Chronic pulmonary nodules.  6.  Hypothyroidism.  7.  Hyperlipidemia.   DIET: Discharged on a low sodium, low fat diet.   ACTIVITY: As tolerated.   FOLLOW-UP: Dr. Vonita MossMark [facility] in the next 1 to 2 weeks.   DISCHARGE MEDICATIONS: Albuterol inhaler 2 puffs four times daily as needed, aspirin 81 mg daily, Celexa 20 mg daily, Advair 115/21, two puffs b.i.d., hydrochlorothiazide 25 mg daily, Synthroid 112 mcg daily, theophylline 100 mg b.i.d., Fosamax 70 mg weekly, Spiriva 1 puff daily, lovastatin 40 mg daily, Lumigan 0.01% ophthalmic solution daily, Roxanol 0.5 mL q.4 hours as needed for shortness of breath/air hunger, prednisone taper starting at 50 mg down to 10 mg over the next 10 days, Levaquin 250 mg daily x5 days, and MiraLax daily as needed for constipation.   CONSULTANTS DURING HOSPITAL COURSE: Dr. Meredeth IdeFleming from pulmonary critical care and Dr. Wendie Simmeramiah from hematology/oncology.   PERTINENT STUDIES DONE DURING THE HOSPITAL COURSE: CT scan of the chest done with contrast on August 13 showing no acute abnormality. No pulmonary embolism. Progression of the bilateral pulmonary nodules suggest the presence of metastatic disease.   HOSPITAL COURSE: This is a 74 year old female with medical problems as mentioned above, who presented to the hospital on 02/12/2013 due to shortness of breath and in acute hypercapnic respiratory failure.   ASSESSMENT AND PLAN:  1.  Acute hypercapnic respiratory failure. This was likely secondary to chronic obstructive pulmonary disease exacerbation. The patient when admitted  to the hospital initially was started on BiPAP, slowly weaned off the BiPAP the day after admission, treated aggressively with IV steroids, around-the-clock nebulizer treatments, maintained on her Advair, Spiriva and theophylline. A pulmonary consult was also obtained  to help with management. The patient over the course of the next few days has significantly improved with less work of breathing, less bronchospasm. The patient therefore presently is being discharged on oral prednisone taper and empiric Levaquin as mentioned, and maintained on Advair, Spiriva and theophylline. She was started on some Roxanol for her air hunger and shortness of breath given her severe chronic obstructive pulmonary disease, which seems to have helped her a lot.  2.  Pulmonary nodules. This was noted on the CT scan on admission and it showed some mild advancement. Therefore, a pulmonary consult and an oncology consult were obtained. The patient was seen by Dr. Meredeth IdeFleming and also by Dr. Wendie Simmeramiah of hematology/oncology. They do not want to pursue any aggressive intervention at this time. Recommended doing a PET scan in 3  months after treatment for her chronic obstructive pulmonary disease exacerbation disease to see if her scans clear.  3.  Abdominal distention/obstipation. The patient has significant bowel distention on the 3rd and 4th day of hospitalization. A KUB was obtained which showed dilated loops of bowel but no evidence of bowel obstruction, but possible ileus and constipation. The patient therefore was given a Dulcolax suppository and started on lactulose with some minimal response. Eventually ended up getting a Fleet enema, which seemed to help the patient quite a bit. She had two bowel movements after the enema and was feeling much  better. She was, therefore, discharged on daily MiraLax as stated.  4.  Hypertension. The patient remained hemodynamically stable in the hospital, maintained on her hydrochlorothiazide and she will  resume that upon discharge.  5.  Hyperlipidemia. The patient was maintained on her lovastatin. She will resume that.  6.  Glaucoma. The patient was maintained on her Lumigan eye drops and she will also resume that upon discharge.   DISPOSITION: The patient was discharged home with home health nursing services.   Time spent is 40 minutes.    ____________________________ Rolly Pancake. Cherlynn Kaiser, MD vjs:np D: 02/18/2013 16:06:48 ET T: 02/18/2013 21:16:50 ET JOB#: [id number]  cc: Rolly Pancake. Cherlynn Kaiser, MD, <Dictator> Steele Sizer, MD  Houston Siren MD ELECTRONICALLY SIGNED 02/24/2013 13:37

## 2014-10-23 NOTE — H&P (Signed)
PATIENT NAME:  Megan Huerta, Megan Huerta MR#:  [id number] DATE OF BIRTH:  11/25/1940  DATE OF ADMISSION:  02/12/2013  PRIMARY CARE PHYSICIAN: [facility].   CHIEF COMPLAINT: Shortness of breath.   HISTORY OF PRESENT ILLNESS: This is a 74 year old female who presented to the hospital due to acute onset of shortness of breath that began earlier this afternoon. The patient said she was washing dishes when she suddenly developed the shortness of breath. She was using a Brillo  pad and then noticed that her hands started to get quite red and inflamed. She thought she was having an allergic reaction. She stopped washing the dishes. Shortly thereafter she developed shortness of breath and therefore was brought to the ER.   Upon arrival the patient was noted to be hypoxic and noted to be in acute hypercapnic respiratory failure. She was emergently started on BiPAP. She was also given an epinephrine pen, a DuoNeb, and also Solu-Medrol en route to the hospital.   Clinically, she feels much better and has been weaned off the BiPAP now. She is not complaining of any shortness of breath presently. She denies any cough, any productive sputum, any fevers, chills, any postnasal drip or any other upper respiratory symptoms. Hospitalist services were contacted for further treatment and evaluation.   REVIEW OF SYSTEMS:  CONSTITUTIONAL: No documented fever. No weight gain or weight loss.  EYES: No blurry or double vision.  ENT: No tinnitus. No postnasal drip. No redness of the oropharynx.  RESPIRATORY: No cough, no wheeze, no hemoptysis. Positive dyspnea. Positive COPD.  CARDIOVASCULAR: No chest pain, no orthopnea, no palpitations, no syncope.  GASTROINTESTINAL: No nausea, no vomiting, no diarrhea. No abdominal pain, no melena or hematochezia.  GENITOURINARY: No dysuria, no hematuria.  ENDOCRINE: No polyuria or nocturia. No heat or cold intolerance.  HEMATOLOGIC: No anemia, no bruising, no bleeding.   INTEGUMENTARY: No rashes. No lesions.  MUSCULOSKELETAL: No arthritis, no swelling, no gout.  NEUROLOGIC: No numbness or tingling. No ataxia. No seizure-type dizziness.  PSYCHIATRIC: No anxiety. No insomnia. No ADD.   PAST MEDICAL HISTORY: Consistent with hypertension; COPD, oxygen dependent; hypothyroidism; hyperlipidemia; osteoporosis; glaucoma.  ALLERGIES: ZITHROMAX AND LATEX.   SOCIAL HISTORY: Used to be a smoker, quit about a few years back but does have a 40-pack-year smoking history. Occasional alcohol abuse. No illicit drug abuse. Lives by herself.   FAMILY HISTORY: Father died from complications of heart disease. Mother died from cancer of unknown type.   CURRENT MEDICATIONS: Advair 115/21 two puffs b.i.d. aspirin 81 mg daily, Celexa 20 mg daily, Fosamax 70 mg weekly, hydrochlorothiazide 25 mg daily, Synthroid 112 mcg daily, lovastatin 40 mg daily, Lumigan 0.01% ophthalmic solution daily, albuterol inhaler 2 puffs q.i.d. as needed, Spiriva 1 puff daily, and theophylline 100 mg one tab b.i.d.   PHYSICAL EXAMINATION:  VITAL SIGNS: Temperature 97.5, pulse 102, respirations 18, blood pressure 105/54, sats 100% on BiPAP.  GENERAL: A pleasant-appearing female in mild respiratory distress.  HEENT: Atraumatic, normocephalic. Extraocular muscles are intact. Pupils equal, reactive to light. Sclerae anicteric. No conjunctival injection. No oropharyngeal erythema.  NECK: Supple. There is no jugular venous distention. No bruits. No lymphadenopathy or thyromegaly.  HEART: Regular rate and rhythm. No murmurs. No rubs. No clicks.  LUNGS: Some mild inspiratory wheezing; otherwise, negative use of accessory muscles. No dullness to percussion. Good air entry bilaterally.  ABDOMEN: Soft, flat, nontender, nondistended. Has good bowel sounds. No hepatosplenomegaly appreciated.  EXTREMITIES: No evidence of any cyanosis, clubbing, or peripheral edema.  Has +2 pedal and radial pulses bilaterally.   NEUROLOGICAL: Alert, awake, and oriented x3 with no focal motor or sensory deficits appreciated bilaterally.  SKIN: Moist and warm with no rashes appreciated.  LYMPHATIC: There is no cervical or axillary lymphadenopathy.   LABORATORY EXAM: A serum glucose of 134, BUN 17, creatinine 0.6, sodium 139, potassium 3.6, chloride 101, bicarb 33, troponin less than 0.02. White cell count 9.4, hemoglobin 13.3, hematocrit 40.5, platelet count 305. ABG showed a pH of 7.23, pCO2 of 83, pO2 of 44, sats 100%.   ASSESSMENT AND PLAN: This is a 74 year old female with a history of chronic obstructive pulmonary disease, hypothyroidism, glaucoma, depression, hyperlipidemia, who presents to the hospital due to acute onset of shortness of breath and noted to be in acute hypercapnic respiratory failure.   1.  Acute hypercapnic respiratory failure. This was likely secondary to chronic obstructive pulmonary disease exacerbation. The patient has significantly improved on BiPAP and has been weaned off of it already. For now, I will treat her with her chronic obstructive pulmonary disease exacerbation with IV steroids, around-the-clock nebulizer treatments, continue her Advair and Spiriva. Her CT chest and chest x-ray was negative for pneumonia; therefore, hold off on antibiotics for now.  2.  Chronic obstructive pulmonary disease exacerbation. The exact etiology of this is unclear. Unlikely it is pneumonia given her negative CT chest and chest x-ray. She did develop a possible allergic reaction to the Brillo pads. Possibly it is related to that. She improved with epinephrine and Solu-Medrol. For now, I will continue IV steroids, around-the-clock nebulizer treatments, continue Advair, Spiriva and theophylline. Will get a pulmonary consult. The patient is well known to Dr. Meredeth IdeFleming.  3.  Pulmonary nodules. The patient had a CT chest which showed worsening pulmonary nodules. She apparently says that she has had this in the past. They  are being followed by pulmonary. I will await further input from Dr. Meredeth IdeFleming for now.  4.  Hypothyroidism. Continue Synthroid.  5.  Glaucoma. Continue Lumigan.  6.  Depression. Continue Celexa.  7.  Hypertension. Continue hydrochlorothiazide.   CODE STATUS: FULL CODE.   TIME SPENT: 50 minutes.   ____________________________ Rolly PancakeVivek J. Cherlynn KaiserSainani, MD vjs:np D: 02/12/2013 21:26:41 ET T: 02/12/2013 22:11:45 ET JOB#: [id number]  cc: Rolly PancakeVivek J. Cherlynn KaiserSainani, MD, <Dictator> Houston SirenVIVEK J Lizza Huffaker MD ELECTRONICALLY SIGNED 02/24/2013 13:37

## 2014-10-23 NOTE — Consult Note (Signed)
Brief Consult Note: Diagnosis: Pneumonia COPD with lung nodules.   Comments: CT scan reviewed by me today. When compared to CT in 2013- overall stable nodules which are too small for biopsy. Would recommend continued therapy for infection and repeat CT in 3 months. Consult appreciated.  Electronic Signatures: Antony Hasteamiah, Zyrus Hetland S (MD)  (Signed 14-Aug-14 14:30)  Authored: Brief Consult Note   Last Updated: 14-Aug-14 14:30 by Antony Hasteamiah, Luma Clopper S (MD)

## 2014-10-24 NOTE — Consult Note (Signed)
   Comments   I spoke with pt in the presence of her cousin. We discussed pt's decision for HCPOA. Pt was afraid that she was burdening her niece in [place] by naming her HCPOA. Assured her that niece is willing to accept that responsibility and pt is in agreement.  spoke with pt at length about code status. Pt is struggling with decision. She clearly says that she does not want CPR/defibrillation in the event of cardiac arrest. However, she is not sure about intubation. Will follow up with her after she has talked with family. Limited code order entered.  spoke with niece in [place] by phone Nino Parsley(Paige Burns # [phone]). Niece is pt's designated Education officer, environmentaldecision-maker. Updated her on pt's current medical status.   Electronic Signatures: Raysean Graumann, Harriett SineNancy (MD)  (Signed 15-Sep-15 15:00)  Authored: Palliative Care   Last Updated: 15-Sep-15 15:00 by Deavion Dobbs, Harriett SineNancy (MD)

## 2014-10-24 NOTE — Consult Note (Signed)
   Comments   Returned to speak again with pt at her request. Pt has thought about reintubation and discussed this with her cousin. She wants to be a DNI. Will change code status to reflect this.   Electronic Signatures: Janis Cuffe, Harriett SineNancy (MD)  (Signed 15-Sep-15 15:09)  Authored: Palliative Care   Last Updated: 15-Sep-15 15:09 by Daeveon Zweber, Harriett SineNancy (MD)

## 2014-10-24 NOTE — Consult Note (Signed)
   Comments   I met with pt's cousin, Donalynn Furlong. She lives in [place] and only sees pt about once a year. She says that pt was still able to live at home alone, did all of her own ADL's, was still driving and doing her own shopping though needed a neighbor to help bring groceries into the house. Cousins impression, however, is that pt has declined since they saw her a year ago. She seems to tire easily, is less active, has lost wt, etc. Cousin found documents in pt's home that suggest that pt was completing a living will though the document was not complete. Cousin says that pt's next of kin is niece, Timoteo Gaul, who lives in [place]. spoke with Timoteo Gaul by phone [phone]). Updated her on pt's current condition including transfer back to CCU. Niece says that pt does not have a HCPOA. Niece recognizes that she is pt's next of kin but is hopeful that pt can improve from current condition and make her wishes known re code status.   Electronic Signatures: Shane Melby, Izora Gala (MD)  (Signed 14-Sep-15 14:37)  Authored: Palliative Care   Last Updated: 14-Sep-15 14:37 by Kayce Chismar, Izora Gala (MD)

## 2014-10-24 NOTE — H&P (Signed)
PATIENT NAME:  Megan Huerta, MOON MR#:  [id number] DATE OF BIRTH:  [birth year]  DATE OF ADMISSION:  03/11/2014  REFERRING PHYSICIAN: Darien Ramus, MD  PRIMARY CARE DOCTOR: Phillips Odor. Jamesetta Orleans, NP  ADMIT DIAGNOSIS: Respiratory failure.   HISTORY OF PRESENT ILLNESS: This is a 74 year old Caucasian female who presented to the Emergency Department via EMS in severe respiratory distress. The patient was found to be hypercapnic with a pCO2 of 120 and air hungry with use of accessory muscles. This required intubation in the Emergency Department, and the staff called for admission to the ICU. The circumstances of her precipitous respiratory decline are unknown. At this time, we are aware that her neighbor somehow found her and called EMS. Because the patient cannot contribute to our own history, much of this information is gleaned from the chart and verbal report from nursing staff in the Emergency Department.   REVIEW OF SYSTEMS: The patient shakes her head yes and no to certain questions as follows:  CONSTITUTIONAL: Fever: None.  EYES: Pain: None.  RESPIRATORY: Cough: Yes. Shortness of breath: Yes. Chest pain: None. GASTROINTESTINAL: Nausea, vomiting: None. Abdominal pain: None.  GENITOURINARY: Dysuria: None. HEMATOLOGIC: Bruising or bleeding: None.  MUSCULOSKELETAL: Pain: None. NEUROLOGIC: Headache: None. Weakness: Yes.  PSYCHIATRIC: Depression: None.   PAST MEDICAL HISTORY: As taken from the MEDICAL RECORD NUMBER 1. Hypertension.  2. Chronic obstructive pulmonary disease (the patient is dependent upon oxygen at home). 3. Hypothyroidism.  4. Hyperlipidemia.  5. Osteoporosis. 6. Glaucoma.   PAST SURGICAL HISTORY: Not available at this time.   FAMILY HISTORY: Coronary artery disease. Her father is deceased from complications of heart disease, and her mother is deceased of an unknown type of cancer.   SOCIAL HISTORY: The patient is a former smoker. Per medical record, she is an occasional abuser of  alcohol, but does not use illegal drugs. She lives by herself.   MEDICATIONS:  1. Advair HFA 115 mcg/21 mcg inhaler 2 puffs inhaled b.i.d.   2. Aspirin 81 mg 1 tab p.o. daily.  3. Celexa 20 mg 1 tab p.o. daily. 4. Fosamax 70 mg 1 tab p.o. once weekly.  5. Hydrochlorothiazide 25 mg 1 tab p.o. daily.  6. Levothyroxine 112 mcg 1 tab p.o. daily.  7. Lovastatin 40 mg 1 tab p.o. every evening.  8. Lumigan 0.1% ophthalmic solution 1 drop to each eye every morning.  9. Prednisone 50 mg 1 tab p.o. daily.  10. Proventil 90 mcg 2 puffs inhaled every 4 hours as needed for coughing, wheezing or shortness of breath.  11. Spiriva 18 mcg 1 inhalation daily.  12. Theophylline 100 mg 1 tablet p.o. b.i.d.   ALLERGIES: ZITHROMAX AND LATEX.  PERTINENT LABORATORY RESULTS AND RADIOGRAPHIC FINDINGS: Glucose is 297, BUN 18, creatinine 1.03, potassium is 3.1, chloride is 91, CO2 39, serum albumin is 3.4, alkaline phosphatase is 66, AST and ALT are normal at 32 and 19, respectively. Troponin is negative. White blood cell count is 11.5. The patient has a normal hemoglobin and hematocrit. Urine appears to be negative for infection. Initial ABG shows a pH of 7.16, pCO2 of 120, pO2 of 87 on 50% FiO2, taken on BiPAP. Followup ABG shows a pH of 7.48, pCO2 of 48, pO2 of 150, base excess of 10.6, bicarbonate of 35.7 on 40% FiO2 on the mechanical ventilator. Chest x-ray shows endotracheal tube terminates at the thoracic inlet after intubation. Prior to intubation, there was marked lung hyperexpansion without acute cardiopulmonary disease.   PHYSICAL EXAMINATION: GENERAL: The  patient is arousable and mechanically ventilated. She cannot verbally respond to questions. She appears in no apparent distress, although she nods her head to being in discomfort due to the endotracheal tube.  HEENT: Normocephalic, atraumatic. Pupils equal, round and reactive to light and accommodation. Extraocular movements are intact. I cannot assess mucous  membranes.  NECK: Trachea is midline. No adenopathy.  CHEST: Symmetric, atraumatic.  CARDIOVASCULAR: Regular rate and rhythm. Normal S1, S2. No rubs, clicks or murmurs.  LUNGS: Clear to auscultation bilaterally. Mechanically ventilated.  ABDOMEN: Positive bowel sounds. Soft, nontender, nondistended. No hepatosplenomegaly.  GENITOURINARY: Foley is in place.  MUSCULOSKELETAL: The patient is able to move all 4 extremities, although she is sluggish as she is slightly sedated.  SKIN: No rashes or lesions.  EXTREMITIES: No clubbing, cyanosis or edema.  NEUROLOGIC: Cranial nerves II through IX are intact as well as XI. Cranial nerves X and XII cannot be assessed as the patient is intubated.  PSYCHIATRIC: The patient cannot be adequately assessed due to being unable to verbally respond to questions at this time.   ASSESSMENT AND PLAN: This is a 74 year old female admitted for hypercapnic respiratory failure.   1. Respiratory failure. The patient has been intubated. She is on assist control with a PEEP of 5, rate of 18 and FiO2 of 40%. Her blood gas has improved dramatically. The circumstances surrounding her respiratory failure are unclear, but the patient does have a history of chronic obstructive pulmonary disease. She has been started on Zyvox.  2. Sepsis. The patient meets criteria by leukocytosis and progressive hypotension. She was initially fluid responsive; however, I have had to start dopamine following intubation. Blood cultures were not drawn at the time of antibiotic dosing. We will add these now. I have also added stress dose steroids.   3. Hypertension. This is on the patient's past medical history, but obviously, we will hold any antihypertensive medications while the patient is critically ill.  4. Hypothyroidism. We will dose levothyroxine IV while the patient is critically ill.  5. Hyperlipidemia. We may resume her statin therapy once she is able to take medicines by mouth.  6.  Osteoporosis. I will check a vitamin D level. We can restart Fosamax once the patient is extubated.  7. Glaucoma. We will restart the patient's Lumigan eyedrops.  8. Deep vein thrombosis prophylaxis. Lovenox.  9. Gastrointestinal prophylaxis. IV famotidine.   CODE STATUS: The patient is a full code.   TIME SPENT ON ADMISSION ORDERS AND CRITICAL PATIENT CARE: Approximately 1 hour.   ____________________________ Kelton PillarMichael S. Sheryle Hailiamond, MD msd:lb D: 03/11/2014 07:35:31 ET T: 03/11/2014 07:59:50 ET JOB#: [id number]  cc: Kelton PillarMichael S. Sheryle Hailiamond, MD, <Dictator> Kelton PillarMICHAEL S Oniyah Rohe MD ELECTRONICALLY SIGNED 03/20/2014 23:58

## 2014-10-24 NOTE — Discharge Summary (Signed)
PATIENT NAME:  Megan Huerta, Megan Huerta MR#:  [id number] DATE OF BIRTH:  May 19, 1941  DATE OF ADMISSION:  03/11/2014 DATE OF DISCHARGE:  03/19/2014  PRIMARY CARE PHYSICIAN:  Elnita Maxwellheryl A. Jamesetta OrleansWicker, NP.  DISCHARGE DIAGNOSES:  1.  Acute on chronic respiratory failure.  2.  Ventilator dependent respiratory failure.  3.  Acute exacerbation of chronic obstructive pulmonary disease.  4.  Left lower lobe healthcare-associated pneumonia.  5.  Sepsis from pneumonia, which was present on admission. 6.  Elevated troponin secondary to demand ischemia.  7.  Multifocal atrial tachycardia.  SECONDARY DISCHARGE DIAGNOSES:  1.  Hypertension. 2.  Hypothyroidism. 3.  Glaucoma.  4.  Constipation.   Please review Dr. Eddie NorthSudini's interim discharge summary dictated on 03/18/2014. Please review history and physical for details.   The patient is currently in the intensive care unit on oxygen via nasal cannula. She was seen by palliative care and her code status was changed to do not resuscitate and do not intubate. Initially the plan was to discharge the patient to skilled nursing facility but after the patient's discussion with Dr. Harvie JuniorPhifer of palliative care today the patient has changed her mind and she has decided to [facility]. This was discussed with the patient's health care power of attorney, her niece who lives in [place], by Dr. Harvie JuniorPhifer and the niece is also agreeable with the current plan discharging [facility] as the patient has terminal-stage COPD.    Her code status will remain DNR/DNI.    DIET: As tolerated.   ACTIVITY: As tolerated.   The patient will be continued on 2-4 liters of oxygen.   MEDICATIONS AT THE TIME OF DISCHARGE:  Celexa 20 mg 1 tablet p.o. once daily, levothyroxine 112 mcg 1 tablet p.o. once daily, Lumigan ophthalmic solution 0.01% one drop each eye once a day in the morning, albuterol 2.5 mg every 4 hours as needed for wheezing, lorazepam 0.5 mg 1-2 tablets orally every 2-4  hours as needed for anxiety, morphine 20 mg/mL give 0.5 mL p.o. every 1-2 hours, Zofran 4 mg ODT 1 tablet every 6 hours as needed for nausea and vomiting.    Total time spent on discharge: 45 minutes.    ____________________________ Megan LabAruna Althia Egolf, MD ag:lt D: 03/19/2014 12:11:22 ET T: 03/19/2014 13:41:15 ET JOB#: [id number]  cc: Megan LabAruna Freddy Spadafora, MD, <Dictator> Cheryl A. Jamesetta OrleansWicker, NP Megan LabARUNA Kynslei Art MD ELECTRONICALLY SIGNED 03/24/2014 13:32

## 2014-10-24 NOTE — Consult Note (Signed)
General Aspect PCP: Megan Huerta. Megan Hy, NP Primary Cardiologist:  Jerilynn Mages. Arida _____________  74 y/o female with a h/o O2 dependent COPD, HTN, HL, and chest pain with nl cors in 2008/2014, who was admitted with acute resp failure and developed tachycardia with question of AFib. _____________   Past Medical History  ??? COPD (chronic obstructive pulmonary disease)    a. On home O2. ??? History of pneumonia  ??? Eczema  ??? Hypothyroidism  ??? Jaw fracture  ??? Osteoporosis  ??? Hyperlipidemia  ??? Asthma  ??? Hypertension  ??? Anxiety  ??? Pulmonary nodules  ??? Osteoporosis  ??? PVC's (premature ventricular contractions)    a. Noted 01/2013. ??? Chest pain    a. 2008 Cath: nl cors;  b. 01/2013 Cath: nl cors, EF 60%.  Past Surgical History  ??? Breast enhancement surgery   ??? Thyroid surgery   ??? Mandible surgery     fractured jaw  ??? Cardiac catheterization  09/2006   EF 60% _____________   Family History  ??? Heart disease Father    Deceased @ 67. ??? Heart disease Brother 33 ??? Heart attack Brother 13 ??? Cancer Mother    Deceased. _____________   Social History ??? Marital Status: Widowed   Spouse Name: N/A   Number of Children: N/A ??? Years of Education: N/A  Occupational History ??? Not on file.  Social History Main Topics ??? Smoking status: Former Smoker -- 1.00 packs/day for 40 years   Types: Cigarettes ??? Smokeless tobacco: Not on file ??? Alcohol Use: No ??? Drug Use: No ??? Sexual Activity: Not on file  Social History Narrative  Lives by herself in [place].  Does not routinely exercise. _____________   Present Illness 74 y/o female with a h/o O2 dependent COPD, HTN, HL, and chest pain with nl cors on caths in 2008 and again in 01/2013.  Pt presented to the [facility] ED via EMS on 9/9 in the setting of hypercapneic resp failure with a pCO2 of 120.  Details of events prior to calling EMS are unclear at this time.  She was intubated in the ER and  admitted to ICU.  She exhibited leukocytosis and hypotension and was treated for presumed sepsis.  She was placed on abx, steroids, inhalers, and was initially placed on dopamine.  She was able to be extubated on 9/10 and dopamine was able to be discontinued.  BCx have returned negative.  She was initially stable on nasal cannula and was able to be transferred out to the floor.  Pt has been tachycardic throughout admission - sinus with frequent PAC's.  Last night she became hypoxic and in that setting was noted to be tachycardic.  It was felt that her rhythm represented afib, however upon review of tele over the past 24 hrs, she has been in sinus tach with frequent pac's and runs of multifocal atrial tachycardia.  Pt remains on bipap this morning and denies chest pain or any history of palpitations.   Physical Exam:  GEN pleasant, on bipap.   HEENT pink conjunctivae   NECK supple  difficult to assess for jvp/bruits 2/2 bipap apparatus.   RESP using bipap - markedly diminished breath sounds bilat with insp/exp wheezing.   CARD Irregular rate and rhythm  Tachycardic  Normal, S1, S2   ABD denies tenderness  soft  normal BS   EXTR negative cyanosis/clubbing, trace bilat LEE.   SKIN normal to palpation   NEURO cranial nerves intact, motor/sensory function intact  PSYCH alert, A+O to time, place, person   Review of Systems:  General: Weakness   Skin: No Complaints   ENT: No Complaints   Eyes: No Complaints   Neck: No Complaints   Respiratory: Short of breath   Cardiovascular: Dyspnea  no c/p or palpitations.   Gastrointestinal: No Complaints   Genitourinary: No Complaints   Vascular: No Complaints   Musculoskeletal: No Complaints   Neurologic: No Complaints   Hematologic: No Complaints   Endocrine: No Complaints   Psychiatric: No Complaints   Review of Systems: All other systems were reviewed and found to be negative   Medications/Allergies Reviewed  Medications/Allergies reviewed   Home Medications: Medication Instructions Status  predniSONE 50 mg oral tablet 1 tab(s) orally once a day Active  Proventil HFA CFC free 90 mcg/inh inhalation aerosol 2 puff(s) inhaled 4 times a day, As Needed - for Shortness of Breath Active  aspirin 81 mg oral tablet 1 tab(s) orally once a day Active  CeleXA 20 mg oral tablet 1 tab(s) orally once a day Active  Advair HFA CFC free 115 mcg-21 mcg/inh inhalation aerosol 2 puff(s) inhaled 2 times a day Active  hydrochlorothiazide 25 mg oral tablet 1 tab(s) orally once a day Active  levothyroxine 112 mcg (0.112 mg) oral tablet 1 tab(s) orally once a day Active  theophylline 100 mg oral tablet, extended release 1 tab(s) orally 2 times a day Active  Fosamax 70 mg oral tablet 1 tab(s) orally once a week on Friday Active  Spiriva 18 mcg inhalation capsule 1 each inhaled once a day Active  lovastatin 40 mg oral tablet 1 tab(s) orally once a day (in the evening) Active  Lumigan 0.01% ophthalmic solution 1 drop(s) to each eye once a day (in the morning) Active   Lab Results:  Thyroid:  09-Sep-15 07:24   Thyroid Stimulating Hormone  0.229 (0.45-4.50 (IU = International Unit)  ----------------------- Pregnant patients have  different reference  ranges for TSH:  - - - - - - - - - -  Pregnant, first trimetser:  0.36 - 2.50 uIU/mL)  TDMs:  11-Sep-15 04:28   Theophylline, Serum  4.6 (Result(s) reported on 13 Mar 2014 at 07:09AM.)  Routine Micro:  09-Sep-15 07:24   Culture Comment NO GROWTH AEROBICALLY/ANAEROBICALLY IN 5 DAYS  Result(s) reported on 16 Mar 2014 at 07:00AM.    07:40   Culture Comment NO GROWTH AEROBICALLY/ANAEROBICALLY IN 5 DAYS  Result(s) reported on 16 Mar 2014 at 07:00AM.  Routine Chem:  11-Sep-15 04:28   Glucose, Serum 97  BUN 13  Creatinine (comp)  0.51  Sodium, Serum 144  Potassium, Serum 4.3  Chloride, Serum 104  CO2, Serum 32  Calcium (Total), Serum  7.8  Anion Gap 8  Osmolality  (calc) 287  eGFR (African American) >60  eGFR (Non-African American) >60 (eGFR values <30m/min/1.73 m2 may be an indication of chronic kidney disease (CKD). Calculated eGFR is useful in patients with stable renal function. The eGFR calculation will not be reliable in acutely ill patients when serum creatinine is changing rapidly. It is not useful in  patients on dialysis. The eGFR calculation may not be applicable to patients at the low and high extremes of body sizes, pregnant women, and vegetarians.)  Magnesium, Serum 2.1 (1.8-2.4 THERAPEUTIC RANGE: 4-7 mg/dL TOXIC: > 10 mg/dL  -----------------------)  Phosphorus, Serum  2.4 (Result(s) reported on 13 Mar 2014 at 07:13AM.)  14-Sep-15 06:27   Glucose, Serum  161  BUN 11  Creatinine (comp)  0.51  Sodium, Serum 136  Potassium, Serum 3.7  Chloride, Serum  82  Calcium (Total), Serum 9.0  eGFR (African American) >60  eGFR (Non-African American) >60 (eGFR values <70m/min/1.73 m2 may be an indication of chronic kidney disease (CKD). Calculated eGFR is useful in patients with stable renal function. The eGFR calculation will not be reliable in acutely ill patients when serum creatinine is changing rapidly. It is not useful in  patients on dialysis. The eGFR calculation may not be applicable to patients at the low and high extremes of body sizes, pregnant women, and vegetarians.)  Cardiac:  08-Sep-15 22:28   Troponin I < 0.02 (0.00-0.05 0.05 ng/mL or less: NEGATIVE  Repeat testing in 3-6 hrs  if clinically indicated. >0.05 ng/mL: POTENTIAL  MYOCARDIAL INJURY. Repeat  testing in 3-6 hrs if  clinically indicated. NOTE: An increase or decrease  of 30% or more on serial  testing suggests a  clinically important change)  13-Sep-15 22:25   Troponin I  0.20 (0.00-0.05 0.05 ng/mL or less: NEGATIVE  Repeat testing in 3-6 hrs  if clinically indicated. >0.05 ng/mL: POTENTIAL  MYOCARDIAL INJURY. Repeat  testing in 3-6 hrs if   clinically indicated. NOTE: An increase or decrease  of 30% or more on serial  testing suggests a  clinically important change)  14-Sep-15 02:18   Troponin I  0.18 (0.00-0.05 0.05 ng/mL or less: NEGATIVE  Repeat testing in 3-6 hrs  if clinically indicated. >0.05 ng/mL: POTENTIAL  MYOCARDIAL INJURY. Repeat  testing in 3-6 hrs if  clinically indicated. NOTE: An increase or decrease  of 30% or more on serial  testing suggests a  clinically important change)    06:27   CK, Total 89 (26-192 NOTE: NEW REFERENCE RANGE  08/04/2013)  CPK-MB, Serum  6.8 (Result(s) reported on 16 Mar 2014 at 07:12AM.)  Routine Hem:  08-Sep-15 22:28   WBC (CBC)  11.5  10-Sep-15 03:56   WBC (CBC)  14.6  11-Sep-15 04:28   WBC (CBC)  13.7  RBC (CBC) 4.11  Hemoglobin (CBC) 12.0  Hematocrit (CBC) 37.9  Platelet Count (CBC) 285  MCV 92  MCH 29.3  MCHC  31.8  RDW  14.7  Neutrophil % 77.8  Lymphocyte % 11.7  Monocyte % 10.1  Eosinophil % 0.1  Basophil % 0.3  Neutrophil #  10.7  Lymphocyte # 1.6  Monocyte #  1.4  Eosinophil # 0.0  Basophil # 0.0 (Result(s) reported on 13 Mar 2014 at 06:41AM.)   EKG:  EKG Interp. by me   Interpretation sinus tachycardia, pvc's, 130, left axis, lat st dep (previously noted 01/2013).   Radiology Results: XRay:    14-Sep-15 01:02, Chest Portable Single View  Chest Portable Single View   REASON FOR EXAM:    shortness of breath  COMMENTS:       PROCEDURE: DXR - DXR PORTABLE CHEST SINGLE VIEW  - Mar 16 2014  1:02AM     CLINICAL DATA:  Shortness of breath.    EXAM:  PORTABLE CHEST - 1 VIEW    COMPARISON:  Chest radiograph performed 03/15/2014    FINDINGS:  The lungs are hyperexpanded, with flattening of the hemidiaphragms,  likely reflecting COPD. Mild left basilar opacity may reflect  atelectasis or possibly pneumonia, as on the recent prior study.  There is no evidence of pleural effusion or pneumothorax.    The cardiomediastinal silhouette is within  normal limits. No acute  osseous abnormalities are seen.     IMPRESSION:  1. Mild left basilar opacity may reflect atelectasis or possibly  pneumonia, as noted on the recent priorstudy.  2. Findings of COPD.      Electronically Signed    By: Garald Balding M.D.    On: 03/16/2014 01:35     Verified By: JEFFREY . CHANG, M.D.,    Zithromax: Muscles aches  Latex: Other  Vital Signs/Nurse's Notes: **Vital Signs.:   14-Sep-15 08:04  Vital Signs Type Routine  Temperature Temperature (F) 98.2  Celsius 36.7  Temperature Source oral  Pulse Pulse 123  Respirations Respirations 26  Systolic BP Systolic BP 035  Diastolic BP (mmHg) Diastolic BP (mmHg) 75  Mean BP 94  Pulse Ox % Pulse Ox % 97  Pulse Ox Activity Level  At rest  Oxygen Delivery Non-invasive ventilation (CPAP/BIPAP)  *Intake and Output.:   Daily 14-Sep-15 07:00  Grand Totals Intake:   Output:  2800    Net:  -2800 24 Hr.:  -2800  Oral Intake      In:  0  Urine ml     Out:  2800  Length of Stay Totals Intake:  11024.8 Output:  10250    Net:  774.8    Impression 1.  Multifocal Atrial Tachycardia:  Tele reviewed.  No evidence of afib.  Sinus tach with frequent PAC's and runs of MAT with rates climbinig into the 170's for brief periods.  Pt is currently dyspneic and requiring bipap.  I suspect that dyspnea/hypoxia are driving tachycardia rather than the other way around.  Cont dilt 53m q 6h.  Cont to treat underlying lung dzs/copd flare ->steroids, inhalers, abx per internal medicine.  2.  Acute exacerbation of COPD:  Per internal medicine.  3.  Hypertension:  Came in hypotensive req dopamine, this has since resolved.  She is tolerating diltiazem - follow.  4.  HL:  On statin.   Electronic Signatures for Addendum Section:  AKathlyn Sacramento(MD) (Signed Addendum 14-Sep-15 09:44)  The patient was seen and examined. Agree with the above. She is asleep. No murmurs by exam but still  mildly tachycardiac. ECG and tele  reviewed. No evidence of A-fib. There is multifocal atrial tachycardia which is likely caused by underlying lung disease.  Continue rate control with Diltiazem.   Electronic Signatures: AKathlyn Sacramento(MD)  (Signed 14-Sep-15 09:44)  Co-Signer: General Aspect/Present Illness, Home Medications, Allergies BRogelia Mire(NP)  (Signed 14-Sep-15 09:26)  Authored: General Aspect/Present Illness, History and Physical Exam, Review of System, Home Medications, Labs, EKG , Radiology, Allergies, Vital Signs/Nurse's Notes, Impression/Plan   Last Updated: 14-Sep-15 09:44 by AKathlyn Sacramento(MD)

## 2015-03-29 IMAGING — CR DG CHEST 1V PORT
1 series · 2 of 2 positions shown · non-contrast
Comparison: 02/16/2014; 08/30/2013; chest CT - 10/03/2013

CLINICAL DATA: Respiratory distress.  History of COPD.

EXAM:
PORTABLE CHEST - 1 VIEW

[Series 1: ap · 0.17mm/px · 2 of 2 slices shown]
[im 1/2]
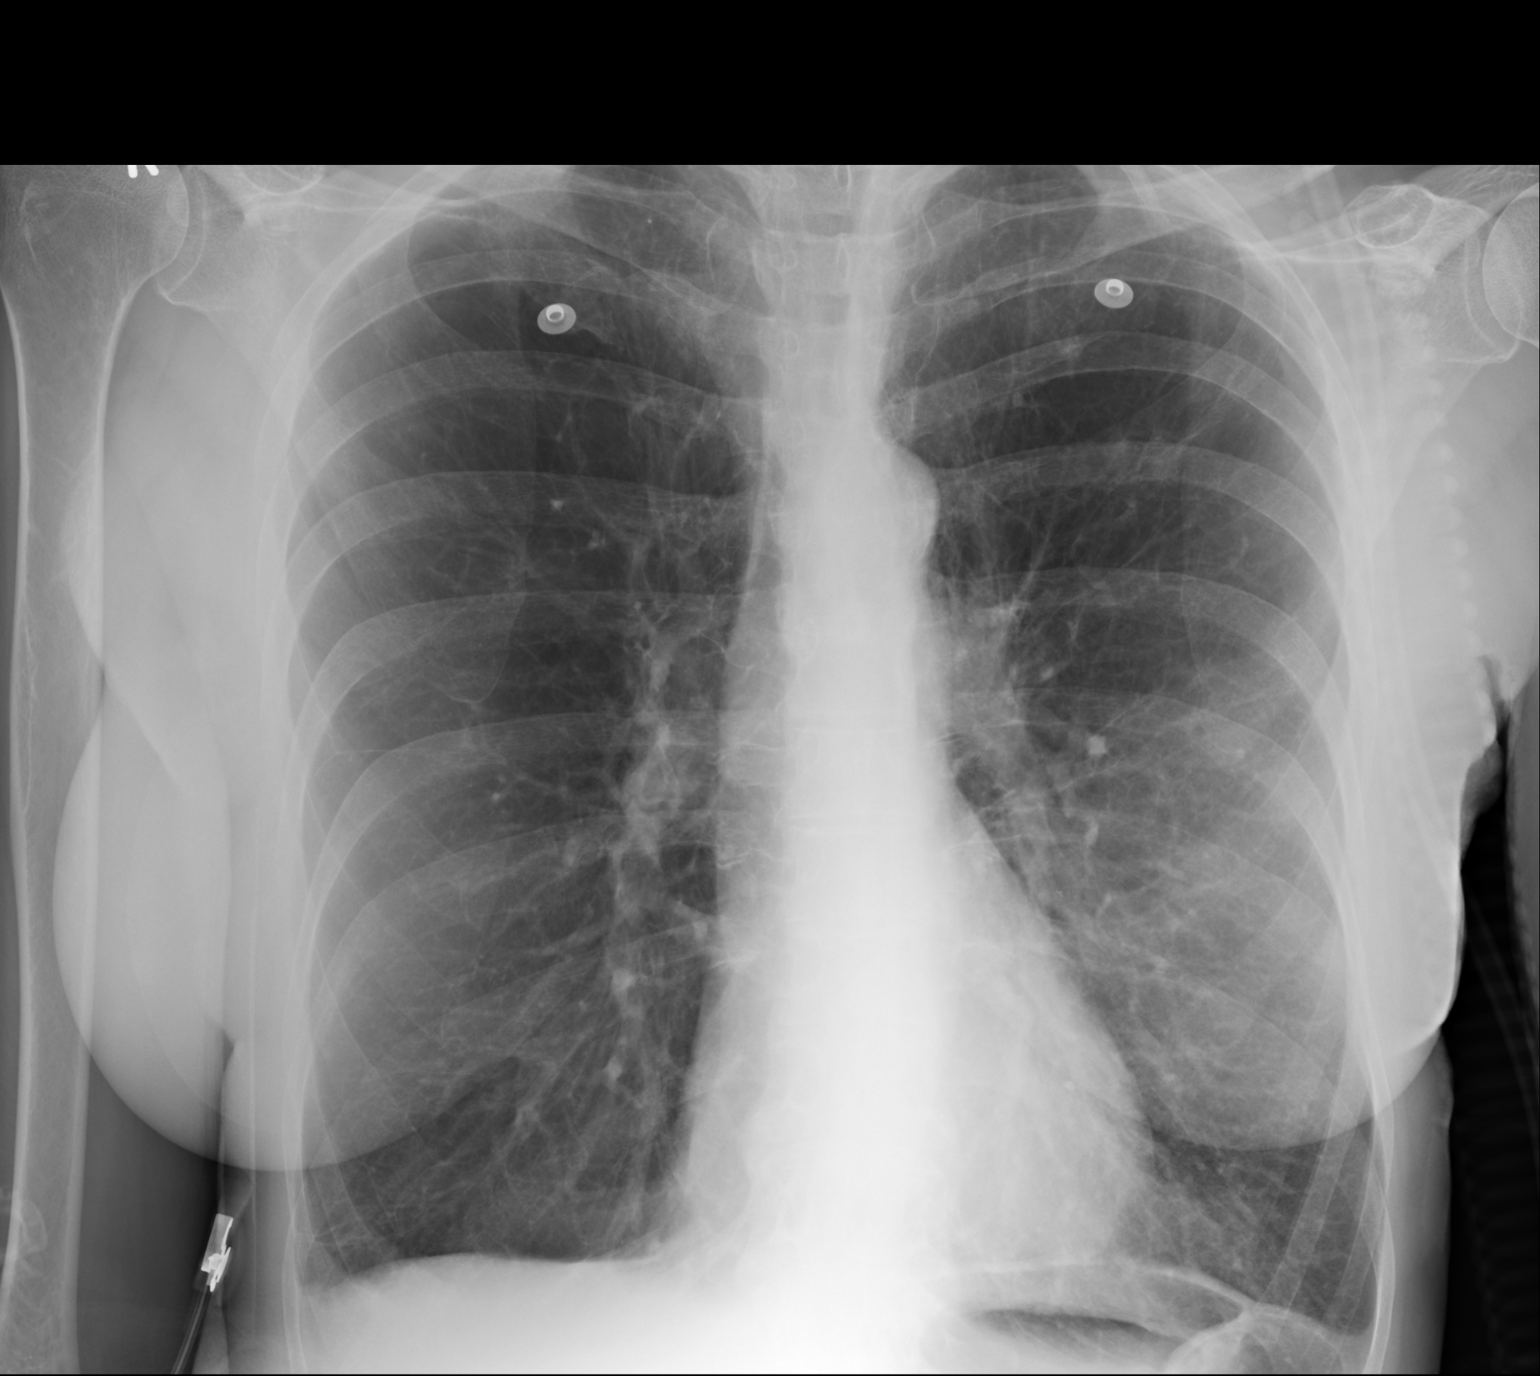
[im 2/2]
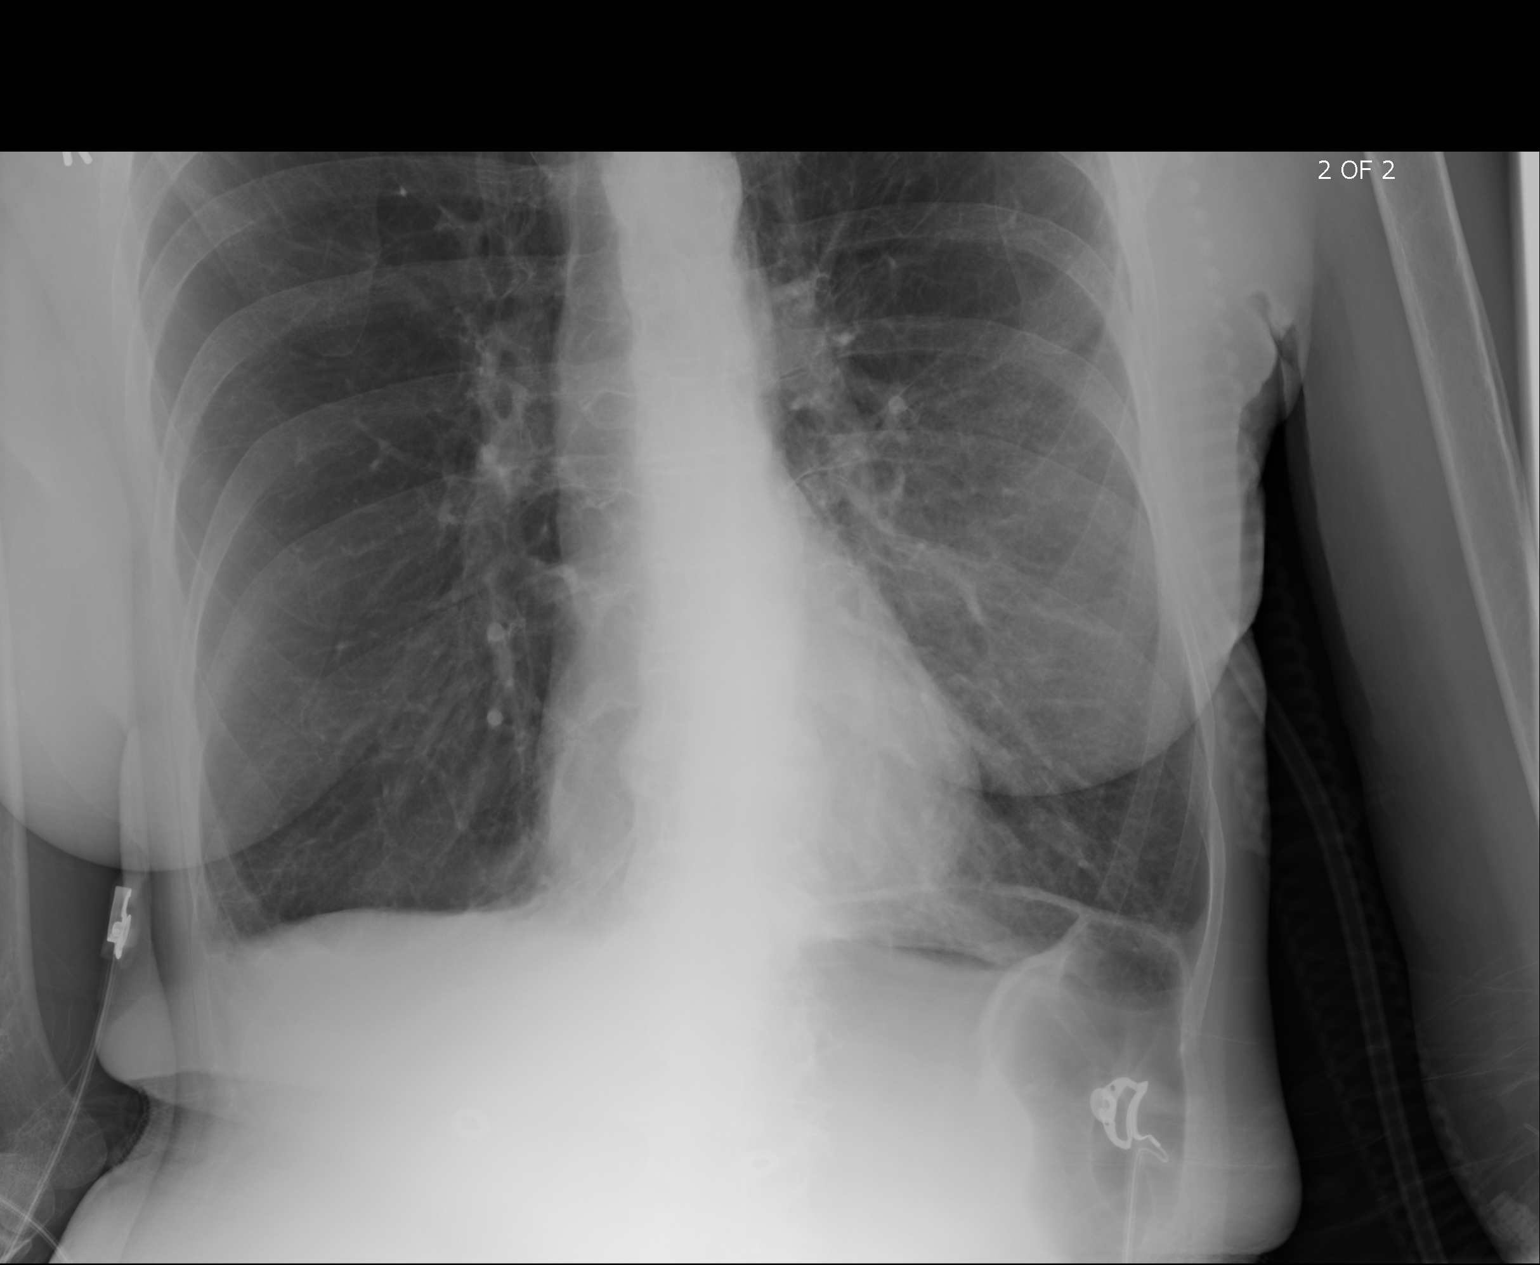

[2 of 2 positions shown; findings below may reference images not displayed]

FINDINGS: Grossly unchanged cardiac silhouette and mediastinal contours. The
lungs are hyperexpanded with flattening of the diaphragms and
blunting of the bilateral costophrenic angles. There is thinning of
the pulmonary parenchyma within the bilateral upper lungs, right
greater than left. No focal airspace opacities. No pleural effusion
or pneumothorax. No evidence of edema. Known pulmonary nodules
within the left upper lobe less conspicuous on the present
examination. No acute osseus abnormalities.
IMPRESSION: Marked lung hyperexpansion without acute cardiopulmonary disease.

## 2015-03-29 IMAGING — CR DG CHEST 1V PORT
1 series · 2 of 2 positions shown · non-contrast
Comparison: 03/10/2014

CLINICAL DATA: Post intubation

EXAM:
PORTABLE CHEST - 1 VIEW

[Series 1: ap · 0.17mm/px · 2 of 2 slices shown]
[im 1/2]
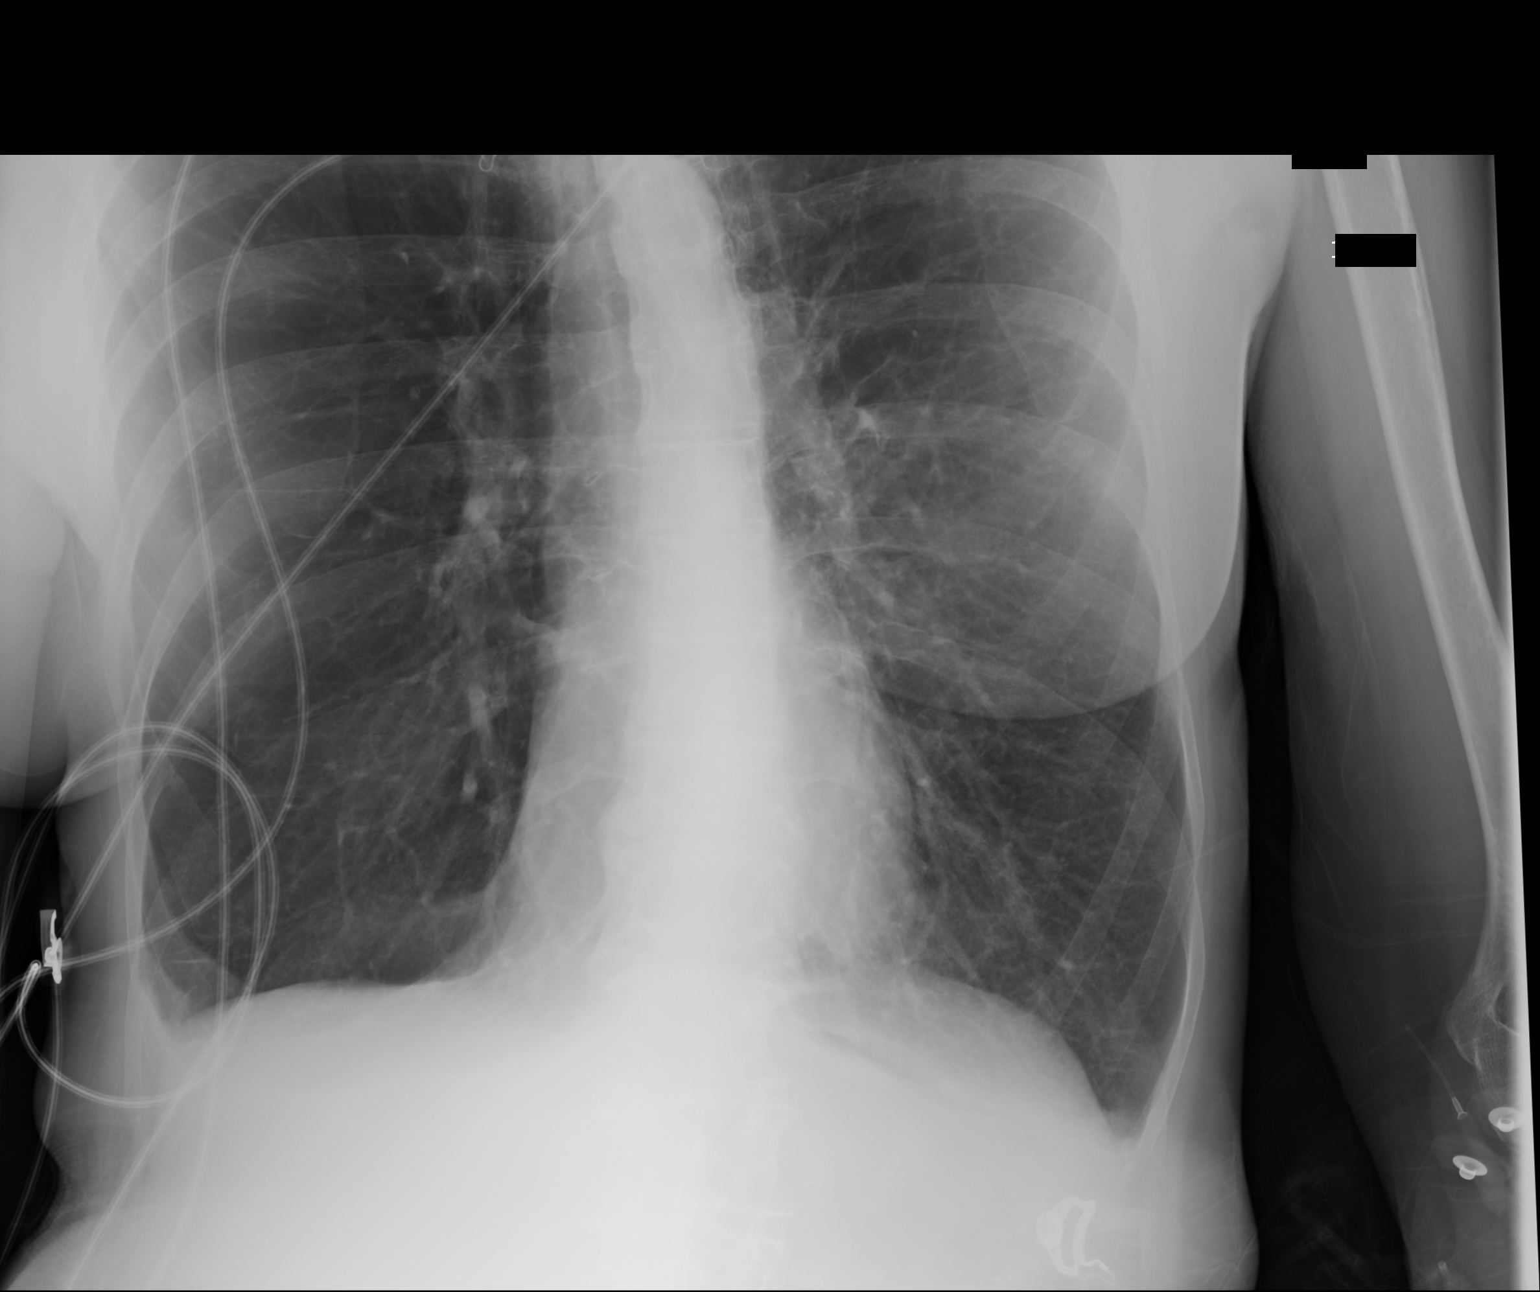
[im 2/2]
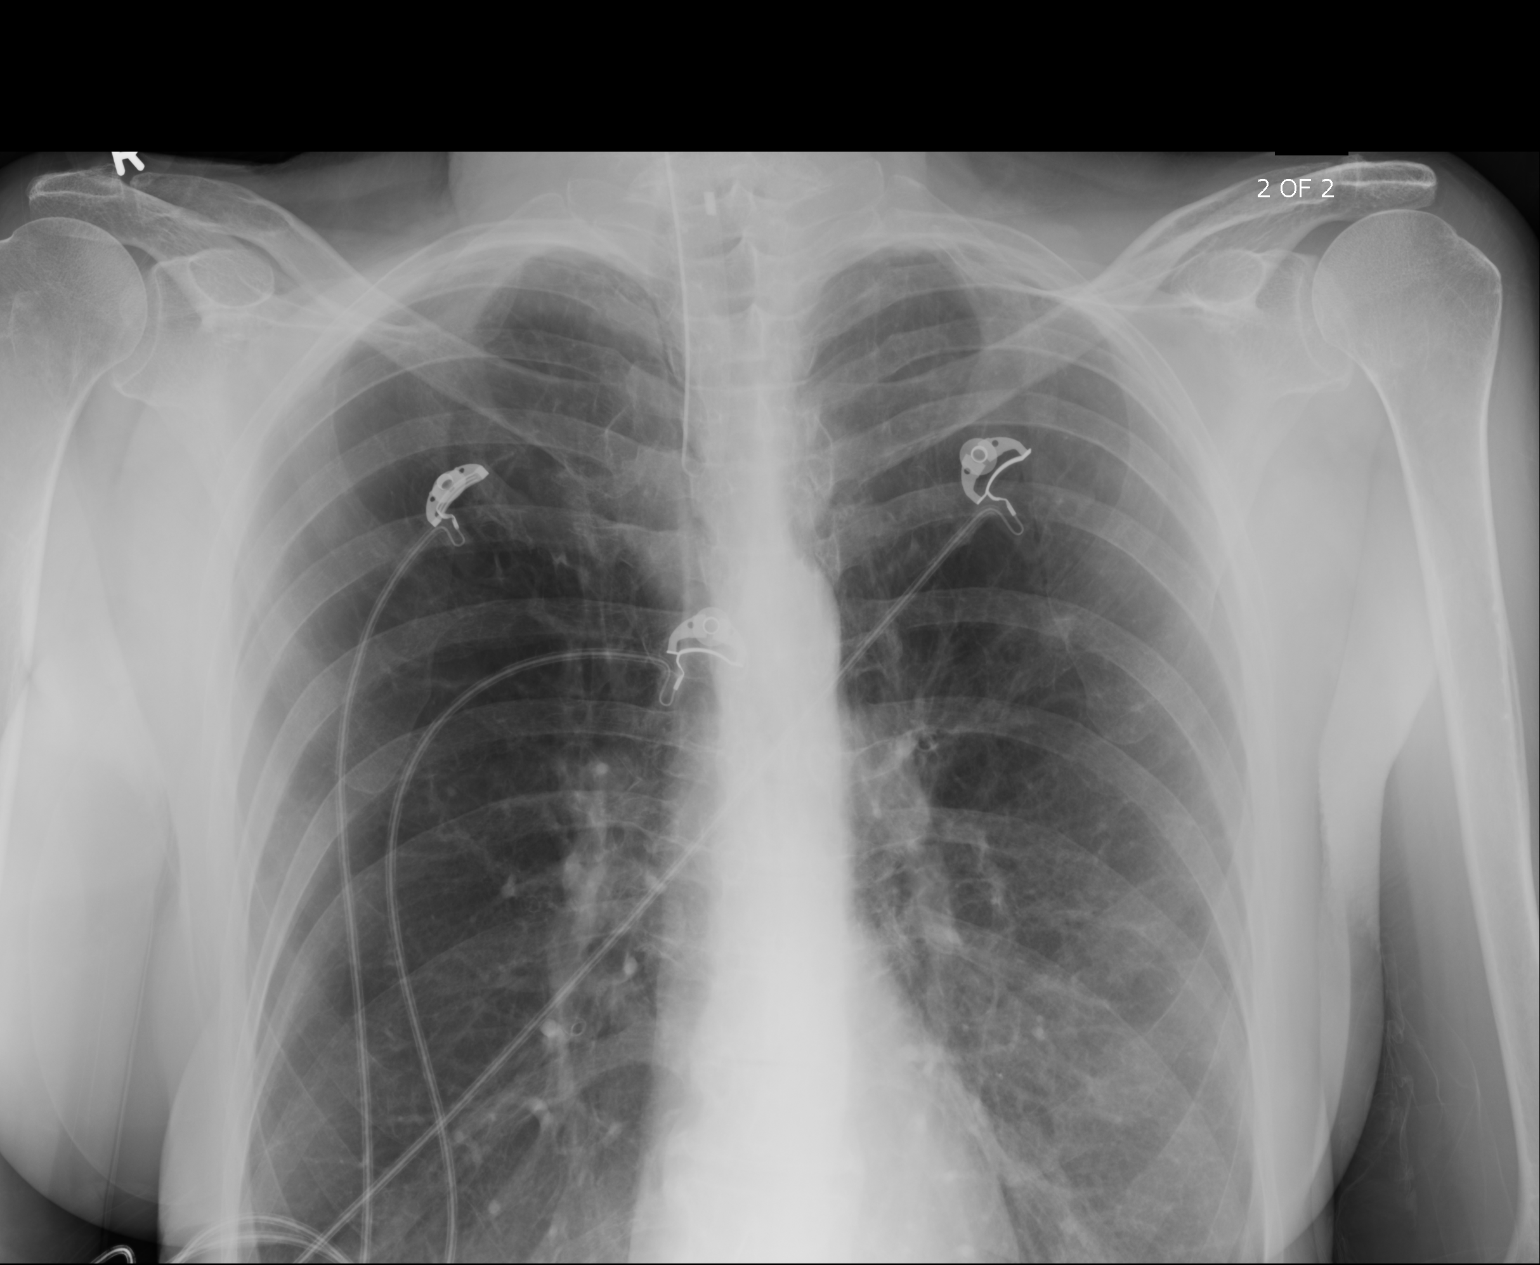

[2 of 2 positions shown; findings below may reference images not displayed]

FINDINGS: Endotracheal tube terminates at the thoracic inlet.

Chronic interstitial markings/emphysematous changes. Mild blunting
of the right costophrenic angle.

The heart is normal in size.
IMPRESSION: Endotracheal tube terminates at the thoracic inlet.
